# Patient Record
Sex: Female | Born: 1990 | Race: White | Hispanic: No | Marital: Single | Smoking: Never smoker
Health system: Southern US, Community
[De-identification: ages and names within clinical notes are randomized; demographics above are authoritative.]

## PROBLEM LIST (undated history)

## (undated) DIAGNOSIS — N83209 Unspecified ovarian cyst, unspecified side: Secondary | ICD-10-CM

## (undated) DIAGNOSIS — F32A Depression, unspecified: Secondary | ICD-10-CM

## (undated) DIAGNOSIS — F329 Major depressive disorder, single episode, unspecified: Secondary | ICD-10-CM

## (undated) DIAGNOSIS — K529 Noninfective gastroenteritis and colitis, unspecified: Secondary | ICD-10-CM

## (undated) DIAGNOSIS — E079 Disorder of thyroid, unspecified: Secondary | ICD-10-CM

## (undated) HISTORY — DX: Unspecified ovarian cyst, unspecified side: N83.209

## (undated) HISTORY — DX: Major depressive disorder, single episode, unspecified: F32.9

## (undated) HISTORY — DX: Depression, unspecified: F32.A

## (undated) HISTORY — DX: Disorder of thyroid, unspecified: E07.9

## (undated) HISTORY — DX: Noninfective gastroenteritis and colitis, unspecified: K52.9

---
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
## 2016-06-22 ENCOUNTER — Encounter: Payer: Self-pay | Admitting: *Deleted

## 2016-06-22 ENCOUNTER — Ambulatory Visit (INDEPENDENT_AMBULATORY_CARE_PROVIDER_SITE_OTHER): Payer: BLUE CROSS/BLUE SHIELD | Admitting: Primary Care

## 2016-06-22 ENCOUNTER — Ambulatory Visit
Admission: RE | Admit: 2016-06-22 | Discharge: 2016-06-22 | Disposition: A | Discharge status: Home / Self Care | Payer: BLUE CROSS/BLUE SHIELD | Source: Ambulatory Visit | Attending: Primary Care | Admitting: Primary Care

## 2016-06-22 ENCOUNTER — Encounter: Payer: Self-pay | Admitting: Primary Care

## 2016-06-22 VITALS — BP 112/64 | HR 82 | Temp 98.2°F | Ht 67.5 in | Wt 202.8 lb

## 2016-06-22 DIAGNOSIS — F418 Other specified anxiety disorders: Secondary | ICD-10-CM

## 2016-06-22 DIAGNOSIS — N83202 Unspecified ovarian cyst, left side: Secondary | ICD-10-CM | POA: Diagnosis not present

## 2016-06-22 DIAGNOSIS — N83209 Unspecified ovarian cyst, unspecified side: Secondary | ICD-10-CM | POA: Insufficient documentation

## 2016-06-22 DIAGNOSIS — F419 Anxiety disorder, unspecified: Secondary | ICD-10-CM

## 2016-06-22 DIAGNOSIS — F329 Major depressive disorder, single episode, unspecified: Secondary | ICD-10-CM | POA: Insufficient documentation

## 2016-06-22 DIAGNOSIS — R197 Diarrhea, unspecified: Secondary | ICD-10-CM | POA: Diagnosis not present

## 2016-06-22 DIAGNOSIS — E039 Hypothyroidism, unspecified: Secondary | ICD-10-CM

## 2016-06-22 DIAGNOSIS — R1032 Left lower quadrant pain: Secondary | ICD-10-CM | POA: Insufficient documentation

## 2016-06-22 DIAGNOSIS — R933 Abnormal findings on diagnostic imaging of other parts of digestive tract: Secondary | ICD-10-CM | POA: Insufficient documentation

## 2016-06-22 DIAGNOSIS — N946 Dysmenorrhea, unspecified: Secondary | ICD-10-CM

## 2016-06-22 DIAGNOSIS — F32A Depression, unspecified: Secondary | ICD-10-CM | POA: Insufficient documentation

## 2016-06-22 LAB — POC URINALSYSI DIPSTICK (AUTOMATED)
Bilirubin, UA: NEGATIVE
Blood, UA: NEGATIVE
Glucose, UA: NEGATIVE
Ketones, UA: NEGATIVE
LEUKOCYTES UA: NEGATIVE
Nitrite, UA: NEGATIVE
PROTEIN UA: NEGATIVE
UROBILINOGEN UA: NEGATIVE
pH, UA: 6

## 2016-06-22 LAB — COMPREHENSIVE METABOLIC PANEL
ALT: 12 U/L (ref 0–35)
AST: 13 U/L (ref 0–37)
Albumin: 4.1 g/dL (ref 3.5–5.2)
Alkaline Phosphatase: 80 U/L (ref 39–117)
BUN: 12 mg/dL (ref 6–23)
CHLORIDE: 107 meq/L (ref 96–112)
CO2: 24 mEq/L (ref 19–32)
CREATININE: 0.82 mg/dL (ref 0.40–1.20)
Calcium: 9.7 mg/dL (ref 8.4–10.5)
GFR: 89.9 mL/min (ref >60.00)
Glucose, Bld: 97 mg/dL (ref 70–99)
Potassium: 4.1 mEq/L (ref 3.5–5.1)
SODIUM: 139 meq/L (ref 135–145)
Total Bilirubin: 0.3 mg/dL (ref 0.2–1.2)
Total Protein: 7 g/dL (ref 6.0–8.3)

## 2016-06-22 LAB — CBC WITH DIFFERENTIAL/PLATELET
BASOS ABS: 0 10*3/uL (ref 0.0–0.1)
BASOS PCT: 0.4 % (ref 0.0–3.0)
Eosinophils Absolute: 0.1 10*3/uL (ref 0.0–0.7)
Eosinophils Relative: 1.7 % (ref 0.0–5.0)
HCT: 37 % (ref 36.0–46.0)
Hemoglobin: 12.6 g/dL (ref 12.0–15.0)
LYMPHS PCT: 30.5 % (ref 12.0–46.0)
Lymphs Abs: 2 10*3/uL (ref 0.7–4.0)
MCHC: 34 g/dL (ref 30.0–36.0)
MCV: 86.4 fl (ref 78.0–100.0)
MONO ABS: 0.6 10*3/uL (ref 0.1–1.0)
Monocytes Relative: 8.6 % (ref 3.0–12.0)
NEUTROS ABS: 3.8 10*3/uL (ref 1.4–7.7)
NEUTROS PCT: 58.8 % (ref 43.0–77.0)
PLATELETS: 353 10*3/uL (ref 150.0–400.0)
RBC: 4.29 Mil/uL (ref 3.87–5.11)
RDW: 13.2 % (ref 11.5–15.5)
WBC: 6.4 10*3/uL (ref 4.0–10.5)

## 2016-06-22 LAB — POCT URINE PREGNANCY: PREG TEST UR: NEGATIVE

## 2016-06-22 MED ORDER — HYDROXYZINE HCL 25 MG PO TABS: 25.0000 mg | ORAL_TABLET | Freq: Two times a day (BID) | ORAL | 0 refills | Status: AC | PRN

## 2016-06-22 MED ORDER — HYDROXYZINE HCL 25 MG PO TABS: 25.0000 mg | ORAL_TABLET | Freq: Two times a day (BID) | ORAL | 0 refills | Status: DC | PRN

## 2016-06-22 MED ORDER — IOPAMIDOL (ISOVUE-300) INJECTION 61%
100.0000 mL | Freq: Once | INTRAVENOUS | Status: AC | PRN
  Administered 2016-06-22: 100 mL via INTRAVENOUS

## 2016-06-22 NOTE — Progress Notes (Signed)
Subjective:    Patient ID: Bonnie HammansRuth Horne, female    DOB: 06-Sep-1991, 25 y.o.   MRN: [id number]  HPI  Bonnie Horne is a 25 year old female who presents today to establish care and discuss the problems mentioned below. Will obtain old records.  1) Abdominal Pain: Located to left groin and left lower quadrant since 06/01/16 which also happened to be the first day of her LMP. That day she experienced painful, heavy bleeding, diarrhea, and several episodes of vomiting. Her symptoms lasted three days and then improved. Since then she's continued to experience intermittent diarrhea, nausea, and left groin and lower quadrant pain. She's taken Naproxen and Tums for her symptoms with some improvement. Over the past week her pain has been more constant and is increasing. Overall the pain is worse. Denies fevers, weakness, fatigue, mucous or blood in the stools.  2) Depression/PTSD, Borderline Personality Disorder: She is following with a therapist and see's her weekly. Currently managed on Zoloft 100 mg, Trazodone PRN which is causing increased irritability, and dissolvable Clonazepam 0.5 mg that she takes infrequently. Her last dose of Trazone was this week, and does not take often given the side effects of increased anxiety. She's not tried anything OTC for difficulty sleeping.   3) Hypothyroidism: Originally diagnosed with hyperthyroidism with goiter and underwent radiation treatment in 2011. Currently managed on levothyroxine 125 mcg. She has been on this dose for 3 years. Her last TSH was normal in February, but she doesn't believe she was taking her medication correctly. She's been taking her medication with food and also with her other medications. She recently learned 2 weeks ago that this was incorrect and has been taking it correctly.   4) Menorrhagia/Dysmenorrhea: Occur monthly on a regular cycle. Will sometimes prevent her from working. LMP 06/01/16. She was previously managed on birth control for  reasons unrelated to current symptoms, but has not taken in years.  Review of Systems  Constitutional: Positive for fatigue. Negative for chills and fever.  HENT: Negative for rhinorrhea.   Respiratory: Negative for shortness of breath.   Cardiovascular: Negative for chest pain.  Gastrointestinal: Positive for abdominal pain, diarrhea and nausea. Negative for blood in stool and vomiting.  Endocrine: Negative for cold intolerance.  Genitourinary: Positive for menstrual problem and pelvic pain. Negative for dysuria, flank pain, hematuria and vaginal discharge.  Neurological: Negative for dizziness and weakness.  Hematological: Negative for adenopathy.  Psychiatric/Behavioral: Positive for sleep disturbance. Negative for suicidal ideas. The patient is not nervous/anxious.        Past Medical History:  Diagnosis Date  . Depression   . Thyroid disease      Social History   Social History  . Marital status: Single    Spouse name: N/A  . Number of children: N/A  . Years of education: N/A   Occupational History  . Not on file.   Social History Main Topics  . Smoking status: Never Smoker  . Smokeless tobacco: Never Used  . Alcohol use No  . Drug use: Unknown  . Sexual activity: Not on file   Other Topics Concern  . Not on file   Social History Narrative  . No narrative on file    Past Surgical History:  Procedure Laterality Date  . TONSILLECTOMY AND ADENOIDECTOMY  1998    Family History  Problem Relation Age of Onset  . Mental illness Mother   . Mental illness Father   . Diabetes Father   . Hypertension Father   .  Arthritis Maternal Grandfather   . Diabetes Paternal Grandmother   . Hypertension Paternal Grandmother   . Diabetes Paternal Grandfather   . Stroke Paternal Grandfather   . Hypertension Paternal Grandfather     No Known Allergies  No current outpatient prescriptions on file prior to visit.   No current facility-administered medications on file  prior to visit.     BP 112/64   Pulse 82   Temp 98.2 F (36.8 C) (Oral)   Ht 5' 7.5" (1.715 m)   Wt 202 lb 12.8 oz (92 kg)   LMP 06/01/2016   SpO2 98%   BMI 31.29 kg/m    Objective:   Physical Exam  Constitutional: She is oriented to person, place, and time. She appears well-nourished.  Neck: Neck supple.  Cardiovascular: Normal rate and regular rhythm.   Pulmonary/Chest: Effort normal and breath sounds normal.  Abdominal: Soft. Normal appearance and bowel sounds are normal. There is tenderness in the suprapubic area and left lower quadrant. There is no CVA tenderness, no tenderness at McBurney's point and negative Murphy's sign.  Neurological: She is alert and oriented to person, place, and time.  Skin: Skin is warm and dry.  Psychiatric: She has a normal mood and affect.          Assessment & Plan:  Abdominal Pain:  Located to LLQ and left groin x 3 weeks. Pain now worse, also with diarrhea. No alarm signs, recent antibiotic use. Exam with moderate tenderness to LLQ and left groin. CT abdomen/pelvis obtained with evidence of left lower colitis and left ovarian cyst. CBC and CMP unremarkable. Given duration of symptoms without resolve, will treat with oral antibiotics. Rx for Augmentin sent to pharmacy. Will likely need birth control for ovarian cyst and menstrual complaints. Will wait until completion of antibiotics. Discussed bland diet. Return precautions provided.  Morrie Sheldon, NP
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
## 2016-06-22 NOTE — Assessment & Plan Note (Signed)
Diagnosed in 2011 originally with hyperthyroidism with goiter. Stable on levothyroxine 125 mcg. Will recheck TSH in 1 month after she's taken her medication correctly.

## 2016-06-22 NOTE — Progress Notes (Signed)
Pre visit review using our clinic review tool, if applicable. No additional management support is needed unless otherwise documented below in the visit note. 

## 2016-06-22 NOTE — Patient Instructions (Signed)
Complete lab work prior to leaving today.   Stop by the front desk and speak with either Shirlee LimerickMarion or Revonda StandardAllison regarding your CT scan.  Only consume a clear liquid diet until you hear from me.  I will be in touch later today.  Start Hydroxyzine 25 mg tablets. Take 1 tablet by mouth at bedtime for sleep/anxiety. May take up to two times daily as needed.  Refrain from using the Clonazepam.  It was a pleasure to meet you today! Please don't hesitate to call me with any questions. Welcome to [facility]!

## 2016-06-22 NOTE — Assessment & Plan Note (Signed)
As evidenced by CT scan. Will likely initiate birth control, especially given other menstrual complaints.  Will await resolve of colitis.

## 2016-06-22 NOTE — Assessment & Plan Note (Signed)
Occurs monthly with menorrhagia. Plan is to initiate birth control after completion of antibiotics.

## 2016-06-25 ENCOUNTER — Other Ambulatory Visit: Payer: Self-pay | Admitting: Primary Care

## 2016-06-25 ENCOUNTER — Telehealth: Payer: Self-pay | Admitting: Primary Care

## 2016-06-25 DIAGNOSIS — K529 Noninfective gastroenteritis and colitis, unspecified: Secondary | ICD-10-CM

## 2016-06-25 MED ORDER — AMOXICILLIN-POT CLAVULANATE 875-125 MG PO TABS: 1.0000 | ORAL_TABLET | Freq: Two times a day (BID) | ORAL | 0 refills | Status: AC

## 2016-06-25 NOTE — Telephone Encounter (Signed)
In the result note of the CT scan, patient is suppose to be taking Augmentin antibiotics. Please advise.

## 2016-06-25 NOTE — Telephone Encounter (Signed)
Per DPR, left detail message of Kate's comments. 

## 2016-06-25 NOTE — Telephone Encounter (Signed)
Pt called because she was in last week and was prescribed an antibiotic but [facility] said they never received the prescription.  Can you please call her medication into [facility].  Thanks!

## 2016-06-25 NOTE — Telephone Encounter (Signed)
Not sure what happened, Rx resent to [facility]. Please notify patient.

## 2016-06-26 ENCOUNTER — Encounter: Payer: Self-pay | Admitting: Primary Care

## 2016-06-26 ENCOUNTER — Encounter: Payer: Self-pay | Admitting: Emergency Medicine

## 2016-06-26 ENCOUNTER — Other Ambulatory Visit: Payer: Self-pay | Admitting: Primary Care

## 2016-06-26 DIAGNOSIS — N83202 Unspecified ovarian cyst, left side: Secondary | ICD-10-CM | POA: Diagnosis not present

## 2016-06-26 DIAGNOSIS — K529 Noninfective gastroenteritis and colitis, unspecified: Secondary | ICD-10-CM | POA: Diagnosis not present

## 2016-06-26 DIAGNOSIS — E039 Hypothyroidism, unspecified: Secondary | ICD-10-CM | POA: Insufficient documentation

## 2016-06-26 DIAGNOSIS — R1012 Left upper quadrant pain: Secondary | ICD-10-CM | POA: Diagnosis present

## 2016-06-26 DIAGNOSIS — R112 Nausea with vomiting, unspecified: Secondary | ICD-10-CM

## 2016-06-26 LAB — COMPREHENSIVE METABOLIC PANEL
ALK PHOS: 76 U/L (ref 38–126)
ALT: 12 U/L — ABNORMAL LOW (ref 14–54)
AST: 22 U/L (ref 15–41)
Albumin: 4.4 g/dL (ref 3.5–5.0)
Anion gap: 8 (ref 5–15)
BILIRUBIN TOTAL: 0.4 mg/dL (ref 0.3–1.2)
BUN: 9 mg/dL (ref 6–20)
CALCIUM: 9.1 mg/dL (ref 8.9–10.3)
CO2: 25 mmol/L (ref 22–32)
Chloride: 106 mmol/L (ref 101–111)
Creatinine, Ser: 0.94 mg/dL (ref 0.44–1.00)
GFR calc non Af Amer: >60 mL/min (ref >60)
Glucose, Bld: 91 mg/dL (ref 65–99)
Potassium: 3.9 mmol/L (ref 3.5–5.1)
Sodium: 139 mmol/L (ref 135–145)
TOTAL PROTEIN: 7.4 g/dL (ref 6.5–8.1)

## 2016-06-26 LAB — URINALYSIS COMPLETE WITH MICROSCOPIC (ARMC ONLY)
Bilirubin Urine: NEGATIVE
GLUCOSE, UA: NEGATIVE mg/dL
Hgb urine dipstick: NEGATIVE
KETONES UR: NEGATIVE mg/dL
NITRITE: NEGATIVE
Protein, ur: NEGATIVE mg/dL
SPECIFIC GRAVITY, URINE: 1.006 (ref 1.005–1.030)
pH: 6 (ref 5.0–8.0)

## 2016-06-26 LAB — CBC
HCT: 36.9 % (ref 35.0–47.0)
HEMOGLOBIN: 12.8 g/dL (ref 12.0–16.0)
MCH: 29.6 pg (ref 26.0–34.0)
MCHC: 34.6 g/dL (ref 32.0–36.0)
MCV: 85.8 fL (ref 80.0–100.0)
Platelets: 317 10*3/uL (ref 150–440)
RBC: 4.3 MIL/uL (ref 3.80–5.20)
RDW: 13 % (ref 11.5–14.5)
WBC: 9.6 10*3/uL (ref 3.6–11.0)

## 2016-06-26 LAB — POCT PREGNANCY, URINE: Preg Test, Ur: NEGATIVE

## 2016-06-26 LAB — LIPASE, BLOOD: Lipase: 20 U/L (ref 11–51)

## 2016-06-26 LAB — TROPONIN I: Troponin I: <0.03 ng/mL (ref <0.03)

## 2016-06-26 MED ORDER — ONDANSETRON 4 MG PO TBDP: 4.0000 mg | ORAL_TABLET | Freq: Three times a day (TID) | ORAL | 0 refills | Status: AC | PRN

## 2016-06-26 NOTE — ED Triage Notes (Signed)
Pt ambulatory to triage with steady gait, no distress noted. Pt sent by [facility] for further evaluation of colitis and ovarian cyst that pt was diagnosed with on Friday 06/22/16. Pt c/o LUQ abdominal pain. Reports N/V/D.

## 2016-06-26 NOTE — ED Notes (Signed)
Pt to triage desk in reports her LUQ abdominal pain has increased. Charge nurse notified.

## 2016-06-27 ENCOUNTER — Emergency Department
Admission: EM | Admit: 2016-06-27 | Discharge: 2016-06-27 | Disposition: A | Discharge status: Home / Self Care | Payer: BLUE CROSS/BLUE SHIELD | Attending: Emergency Medicine | Admitting: Emergency Medicine

## 2016-06-27 DIAGNOSIS — K529 Noninfective gastroenteritis and colitis, unspecified: Secondary | ICD-10-CM

## 2016-06-27 DIAGNOSIS — N83202 Unspecified ovarian cyst, left side: Secondary | ICD-10-CM

## 2016-06-27 DIAGNOSIS — R1012 Left upper quadrant pain: Secondary | ICD-10-CM

## 2016-06-27 MED ORDER — ONDANSETRON HCL 4 MG/2ML IJ SOLN
4.0000 mg | Freq: Once | INTRAMUSCULAR | Status: AC
  Administered 2016-06-27: 4 mg via INTRAVENOUS
  Filled 2016-06-27: qty 2

## 2016-06-27 MED ORDER — MORPHINE SULFATE (PF) 4 MG/ML IV SOLN
INTRAVENOUS | Status: AC
  Filled 2016-06-27: qty 1

## 2016-06-27 MED ORDER — SODIUM CHLORIDE 0.9 % IV BOLUS (SEPSIS)
1000.0000 mL | Freq: Once | INTRAVENOUS | Status: AC
  Administered 2016-06-27: 1000 mL via INTRAVENOUS

## 2016-06-27 MED ORDER — OXYCODONE-ACETAMINOPHEN 5-325 MG PO TABS: 1.0000 | ORAL_TABLET | ORAL | 0 refills | Status: AC | PRN

## 2016-06-27 MED ORDER — MORPHINE SULFATE (PF) 4 MG/ML IV SOLN
4.0000 mg | Freq: Once | INTRAVENOUS | Status: AC
  Administered 2016-06-27: 4 mg via INTRAVENOUS
  Filled 2016-06-27: qty 1

## 2016-06-27 NOTE — ED Provider Notes (Signed)
[facility] Emergency Department Provider Note   ____________________________________________   First MD Initiated Contact with Patient 06/27/16 0106     (approximate)  I have reviewed the triage vital signs and the nursing notes.   HISTORY  Chief Complaint Abdominal Pain    HPI Bonnie HammansRuth Kemmerer is a 25 y.o. female who was instructed by her PCP to present to the emergency department for further evaluation of abdominal pain. Patient saw her PCP 4 days ago; at that time, she had been having a 3 week history of left-sided abdominal and pelvic pain with vomiting and diarrhea. She underwent lab work and CT scan which demonstrated colitis and left-sided ovarian cyst. She was placed on Augmentin only. Patient continued to have some vomiting and diarrhea over the weekend. Spoke with her PCP yesterday who called and Zofran. Then her PCP called to send patient to the ED for further evaluation of her pain. Patient denies associated fever, chills, chest pain, shortness of breath, dysuria. Zofran helped her nausea. Nothing makes her pain better or worse. Denies recent travel, camping or recent antibiotic use prior to starting on Augmentin.   Past Medical History:  Diagnosis Date  . Colitis   . Depression   . Ovarian cyst   . Thyroid disease     Patient Active Problem List   Diagnosis Date Noted  . Anxiety and depression 06/22/2016  . Hypothyroidism 06/22/2016  . Ovarian cyst 06/22/2016  . Dysmenorrhea 06/22/2016    Past Surgical History:  Procedure Laterality Date  . TONSILLECTOMY AND ADENOIDECTOMY  1998    Prior to Admission medications   Medication Sig Start Date End Date Taking? Authorizing Provider  amoxicillin-clavulanate (AUGMENTIN) 875-125 MG tablet Take 1 tablet by mouth 2 (two) times daily. 06/25/16  Yes Doreene NestKatherine K Clark, NP  hydrOXYzine (ATARAX/VISTARIL) 25 MG tablet Take 1 tablet (25 mg total) by mouth 2 (two) times daily as needed for anxiety.  06/22/16  Yes Doreene NestKatherine K Clark, NP  levothyroxine (SYNTHROID, LEVOTHROID) 125 MCG tablet Take 125 mcg by mouth daily before breakfast.   Yes Historical Provider, MD  ondansetron (ZOFRAN ODT) 4 MG disintegrating tablet Take 1 tablet (4 mg total) by mouth every 8 (eight) hours as needed for nausea or vomiting. 06/26/16  Yes Doreene NestKatherine K Clark, NP  sertraline (ZOLOFT) 100 MG tablet Take 100 mg by mouth daily.   Yes Historical Provider, MD  oxyCODONE-acetaminophen (ROXICET) 5-325 MG tablet Take 1 tablet by mouth every 4 (four) hours as needed for severe pain. 06/27/16   Irean HongJade J Leontine Radman, MD    Allergies Review of patient's allergies indicates no known allergies.  Family History  Problem Relation Age of Onset  . Mental illness Mother   . Mental illness Father   . Diabetes Father   . Hypertension Father   . Arthritis Maternal Grandfather   . Diabetes Paternal Grandmother   . Hypertension Paternal Grandmother   . Diabetes Paternal Grandfather   . Stroke Paternal Grandfather   . Hypertension Paternal Grandfather     Social History Social History  Substance Use Topics  . Smoking status: Never Smoker  . Smokeless tobacco: Never Used  . Alcohol use No    Review of Systems  Constitutional: No fever/chills. Eyes: No visual changes. ENT: No sore throat. Cardiovascular: Denies chest pain. Respiratory: Denies shortness of breath. Gastrointestinal: Positive for abdominal pain, nausea, vomiting and diarrhea.  No constipation. Genitourinary: Negative for dysuria. Musculoskeletal: Negative for back pain. Skin: Negative for rash. Neurological: Negative for  headaches, focal weakness or numbness.  10-point ROS otherwise negative.  ____________________________________________   PHYSICAL EXAM:  VITAL SIGNS: ED Triage Vitals  Enc Vitals Group     BP 06/26/16 2207 111/69     Pulse Rate 06/26/16 2207 91     Resp 06/26/16 2207 15     Temp 06/26/16 2207 98 F (36.7 C)     Temp Source 06/26/16  2207 Oral     SpO2 06/26/16 2207 98 %     Weight 06/26/16 2208 190 lb (86.2 kg)     Height 06/26/16 2208 5\' 7"  (1.702 m)     Head Circumference --      Peak Flow --      Pain Score --      Pain Loc --      Pain Edu? --      Excl. in GC? --     Constitutional: Alert and oriented. Well appearing and in no acute distress. Eyes: Conjunctivae are normal. PERRL. EOMI. Head: Atraumatic. Nose: No congestion/rhinnorhea. Mouth/Throat: Mucous membranes are mildly dry.  Oropharynx non-erythematous. Neck: No stridor.   Cardiovascular: Normal rate, regular rhythm. Grossly normal heart sounds.  Good peripheral circulation. Respiratory: Normal respiratory effort.  No retractions. Lungs CTAB. Gastrointestinal: Soft and mildly tender to palpation left upper, lateral and lower quadrant without rebound or guarding. No distention. No abdominal bruits. No CVA tenderness. Musculoskeletal: No lower extremity tenderness nor edema.  No joint effusions. Neurologic:  Normal speech and language. No gross focal neurologic deficits are appreciated. No gait instability. Skin:  Skin is warm, dry and intact. No rash noted. Psychiatric: Mood and affect are normal. Speech and behavior are normal.  ____________________________________________   LABS (all labs ordered are listed, but only abnormal results are displayed)  Labs Reviewed  COMPREHENSIVE METABOLIC PANEL - Abnormal; Notable for the following:       Result Value   ALT 12 (*)    All other components within normal limits  URINALYSIS COMPLETEWITH MICROSCOPIC ([facility] ONLY) - Abnormal; Notable for the following:    Color, Urine YELLOW (*)    APPearance CLEAR (*)    Leukocytes, UA TRACE (*)    Bacteria, UA RARE (*)    Squamous Epithelial / LPF 0-5 (*)    All other components within normal limits  LIPASE, BLOOD  CBC  TROPONIN I  POC URINE PREG, ED  POCT PREGNANCY, URINE   ____________________________________________  EKG  ED ECG REPORT I, Chaos Carlile  J, the attending physician, personally viewed and interpreted this ECG.   Date: 06/27/2016  EKG Time: 2221  Rate: 80  Rhythm: normal EKG, normal sinus rhythm  Axis: Normal  Intervals:none  ST&T Change: Nonspecific  ____________________________________________  RADIOLOGY  None ____________________________________________   PROCEDURES  Procedure(s) performed: None  Procedures  Critical Care performed: No  ____________________________________________   INITIAL IMPRESSION / ASSESSMENT AND PLAN / ED COURSE  Pertinent labs & imaging results that were available during my care of the patient were reviewed by me and considered in my medical decision making (see chart for details).  25 year old female with a recent diagnosis of colitis and ovarian cyst on Augmentin; nothing for pain. She is afebrile with normal white count. Reviewed results of CT scan from last week. Will infuse IV fluids, IV analgesia and reassess.  Clinical Course  Comment By Time  Pain greatly improved after IV morphine. Patient already has Augmentin and Zofran at home. Will write a limited quantity of Percocet as needed for pain. Strict return  precautions given. Patient and mother verbalize understanding and agree with plan of care. Irean Hong, MD 10/11 0320     ____________________________________________   FINAL CLINICAL IMPRESSION(S) / ED DIAGNOSES  Final diagnoses:  Left upper quadrant pain  Cyst of left ovary  Colitis      NEW MEDICATIONS STARTED DURING THIS VISIT:  New Prescriptions   OXYCODONE-ACETAMINOPHEN (ROXICET) 5-325 MG TABLET    Take 1 tablet by mouth every 4 (four) hours as needed for severe pain.     Note:  This document was prepared using Dragon voice recognition software and may include unintentional dictation errors.    Irean Hong, MD 06/27/16 [phone]

## 2016-06-27 NOTE — Discharge Instructions (Signed)
1. Continue and finish antibiotic as prescribed by your doctor. 2. You may take the Zofran you have at home as needed for nausea. 3. You may take Percocet as needed for pain. 4. Continue bland diet as instructed by your doctor. 5. Return to the ER for worsening symptoms, persistent vomiting, difficulty breathing or other concerns.

## 2016-06-27 NOTE — ED Notes (Signed)
Morphine dose was wasted as the plunger was backed out of the syringe by the pressure of the IV bag. Wasted and another dose pulled from pyxis. Administered without incident

## 2016-06-29 ENCOUNTER — Encounter: Payer: Self-pay | Admitting: Primary Care

## 2016-07-02 ENCOUNTER — Other Ambulatory Visit: Payer: Self-pay | Admitting: Primary Care

## 2016-07-02 ENCOUNTER — Encounter: Payer: Self-pay | Admitting: Primary Care

## 2016-07-02 ENCOUNTER — Telehealth: Payer: Self-pay

## 2016-07-02 DIAGNOSIS — R1032 Left lower quadrant pain: Secondary | ICD-10-CM

## 2016-07-02 NOTE — Telephone Encounter (Signed)
Spoken and notified patient of Kate's comments. Patient stated that she is having a lot more pain. So went ahead and schedule appt for 07/03/2016.

## 2016-07-02 NOTE — Telephone Encounter (Signed)
Please call patient back and notify her that I am her PCP. Please schedule her for follow up if her symptoms persist.

## 2016-07-02 NOTE — Telephone Encounter (Signed)
I spoke with pt and she said she does not have PCP and plans to make appt with physician at [facility]. Advised pt she needs to call and schedule appt. Pt voiced understanding.

## 2016-07-02 NOTE — Telephone Encounter (Signed)
PLEASE NOTE: All timestamps contained within this report are represented as [place] Standard Time. CONFIDENTIALTY NOTICE: This fax transmission is intended only for the addressee. It contains information that is legally privileged, confidential or otherwise protected from use or disclosure. If you are not the intended recipient, you are strictly prohibited from reviewing, disclosing, copying using or disseminating any of this information or taking any action in reliance on or regarding this information. If you have received this fax in error, please notify us immediately by telephone so that we can arrange for its return to [place]. Phone: [phone], Toll-Free: [phone], Fax: [phone] Page: 1 of 2 Call Id: [id number] [facility] Night - Client TELEPHONE ADVICE RECORD [facility] Patient Name: Bonnie Horne Gender: Female DOB: [birth year] Age: 25 Y 6 M 15 D Return Phone Number: [phone] (Primary) Address: City/State/Zip: [place] [address] Client [facility] Night - Client Client [facility] - Night Physician Vernona Rieger - NP Contact Type Call Who Is Calling Patient / Member / Family / Caregiver Call Type Triage / Clinical Relationship To Patient Self Return Phone Number [phone] (Primary) Chief Complaint ABDOMINAL PAIN - Severe and only in abdomen Reason for Call Symptomatic / Request for Health Information Initial Comment Caller states she is in severe pain in Abdominal Region and needs Antibiotic, she has Colitis. PreDisposition Call Doctor Translation No Nurse Assessment Nurse: Sabino Gasser, RN, Melissa Date/Time Lamount Cohen Time): 06/30/2016 4:10:13 PM Confirm and document reason for call. If symptomatic, describe symptoms. You must click the next button to save text entered. ---Caller states she is in severe pain in Abdominal Region and needs Antibiotic, she has Colitis. Has the  patient traveled out of the country within the last 30 days? ---Not Applicable Does the patient have any new or worsening symptoms? ---Yes Will a triage be completed? ---Yes Related visit to physician within the last 2 weeks? ---Yes Does the PT have any chronic conditions? (i.e. diabetes, asthma, etc.) ---Yes List chronic conditions. ---anxiety/depression, hypothyroidism, dx with Colitis on Monday. Is the patient pregnant or possibly pregnant? (Ask all females between the ages of 37-55) ---No Is this a behavioral health or substance abuse call? ---No Guidelines Guideline Title Affirmed Question Affirmed Notes Nurse Date/Time Lamount Cohen Time) Abdominal Pain - Female [1] SEVERE pain (e.g., excruciating) AND [2] present > 1 hour [place], RN, Melissa 06/30/2016 4:14:21 PM Disp. Time Lamount Cohen Time) Disposition Final User 06/30/2016 4:08:42 PM Send to Urgent Queue [facility] PLEASE NOTE: All timestamps contained within this report are represented as [place] Standard Time. CONFIDENTIALTY NOTICE: This fax transmission is intended only for the addressee. It contains information that is legally privileged, confidential or otherwise protected from use or disclosure. If you are not the intended recipient, you are strictly prohibited from reviewing, disclosing, copying using or disseminating any of this information or taking any action in reliance on or regarding this information. If you have received this fax in error, please notify us immediately by telephone so that we can arrange for its return to [place]. Phone: [phone], Toll-Free: [phone], Fax: [phone] Page: 2 of 2 Call Id: [id number] 06/30/2016 4:16:33 PM Go to ED Now Yes Sabino Gasser, RN, Efraim Kaufmann Caller Understands: Yes Disagree/Comply: Comply Care Advice Given Per Guideline GO TO ED NOW: You need to be seen in the Emergency Department. Go to the ER at ___________ Hospital. Leave now. Drive carefully. * Please bring a list of your  current medicines when you go to the Emergency  Department (ER). * It is also a good idea to bring the pill bottles too. This will help the doctor to make certain you are taking the right medicines and the right dose. NOTHING BY MOUTH: Do not eat or drink anything for now. (Reason: condition may need surgery and general anesthesia.) Comments User: Oralia ManisMelissa, [place], RN Date/Time (Eastern Time): 06/30/2016 4:19:00 PM caller reports she is having 10/10 pain in the upper abd. all day today, "collapsed at work" today. Caller reports has been on Augmentin since Monday but has not helped , dx with Colitis. Referrals [facility] - ED

## 2016-07-02 NOTE — Telephone Encounter (Signed)
PLEASE NOTE: All timestamps contained within this report are represented as [place] Standard Time. CONFIDENTIALTY NOTICE: This fax transmission is intended only for the addressee. It contains information that is legally privileged, confidential or otherwise protected from use or disclosure. If you are not the intended recipient, you are strictly prohibited from reviewing, disclosing, copying using or disseminating any of this information or taking any action in reliance on or regarding this information. If you have received this fax in error, please notify us immediately by telephone so that we can arrange for its return to [place]. Phone: [phone], Toll-Free: [phone], Fax: [phone] Page: 1 of 2 Call Id: [id number] [facility] Night - Client TELEPHONE ADVICE RECORD [facility] Patient Name: Bonnie Horne Gender: Female DOB: November 12, 1990 Age: 25 Y 6 M 15 D Return Phone Number: [phone] (Primary), [phone] (Secondary) Address: [place] [address] Client [facility] Night - Client Client Site [facility] [place] - Night Physician Vernona Rieger - NP Contact Type Call Who Is Calling Patient / Member / Family / Caregiver Call Type Triage / Clinical Caller Name [place] Winslow Relationship To Patient Mother Return Phone Number [phone] (Primary) Chief Complaint Medication reaction Reason for Call Medication Question Initial Comment Caller states her dtr was recently given abx for colitis and ovarian cysts. However, the abx causes stomach upset adn requests a different abx. Caller would like afterhours to page physician, they refuse to return to the ER. Pharmacy is [facility] on [address] - # [phone] PreDisposition Call Doctor Translation No Nurse Assessment Nurse: Laural Benes, RN, Dondra Spry Date/Time Lamount Cohen Time): 06/30/2016 5:51:12 PM Confirm and document reason for call. If  symptomatic, describe symptoms. You must click the next button to save text entered. ---Desarai was seen in office last week given antibiotic for colitis dx received them on Monday of this week; was seen in ED on Tuesday evening for symptoms that got worse; ED gave pain medications IV due not drinking or eating; anti nausea given also. Continues to be in a lot of pain --stomach is really upset -- augmentin 875mg  Has the patient traveled out of the country within the last 30 days? ---No Does the patient have any new or worsening symptoms? ---Yes Will a triage be completed? ---Yes Related visit to physician within the last 2 weeks? ---Yes Does the PT have any chronic conditions? (i.e. diabetes, asthma, etc.) ---Unknown Is the patient pregnant or possibly pregnant? (Ask all females between the ages of 60-55) ---No Is this a behavioral health or substance abuse call? ---No PLEASE NOTE: All timestamps contained within this report are represented as [place] Standard Time. CONFIDENTIALTY NOTICE: This fax transmission is intended only for the addressee. It contains information that is legally privileged, confidential or otherwise protected from use or disclosure. If you are not the intended recipient, you are strictly prohibited from reviewing, disclosing, copying using or disseminating any of this information or taking any action in reliance on or regarding this information. If you have received this fax in error, please notify us immediately by telephone so that we can arrange for its return to [place]. Phone: [phone], Toll-Free: [phone], Fax: [phone] Page: 2 of 2 Call Id: [id number] Guidelines Guideline Title Affirmed Question Affirmed Notes Nurse Date/Time Lamount Cohen Time) Abdominal Pain - Upper [1] SEVERE pain (e.g., excruciating) AND [2] present > 1 hour Liberty Handy 06/30/2016 5:57:31 PM Disp. Time Lamount Cohen Time) Disposition Final User 06/30/2016 6:04:49 PM Paged On Call  back to  Call Center [place], [place], Gail 06/30/2016 6:33:36 PM Call Completed Liberty HandyJohnson, RN, Gail 06/30/2016 5:59:47 PM Go to ED Now Yes Laural BenesJohnson, RN, Suzi RootsGail Caller Understands: Yes Disagree/Comply: Disagree Disagree/Comply Reason: Disagree with instructions Care Advice Given Per Guideline GO TO ED NOW: You need to be seen in the Emergency Department. Go to the ER at _________[facility] Hospital. Leave now. Drive carefully. DRIVING: Another adult should drive. Do not delay going to the Emergency Department. If immediate transportation is not available via car or taxi, then the patient should be instructed to call [id number]. * Please bring a list of your current medicines when you go to the Emergency Department (ER). * It is also a good idea to bring the pill bottles too. This will help the doctor to make certain you are taking the right medicines and the right dose. NOTHING BY MOUTH: Do not eat or drink anything for now. (Reason: condition may need surgery and general anesthesia.) CARE ADVICE given per Abdominal Pain, Upper (Adult) guideline. Referrals GO TO FACILITY UNDECIDED Paging DoctorName Phone DateTime Result/Outcome Message Type Notes Ruthe Mannanron, Talia - MD [id number] 06/30/2016 6:04:49 PM Paged On Call Back to Call Center Doctor [facility] [phone] Ruthe MannanAron, Talia - MD 06/30/2016 6:33:26 PM Spoke with On Call - Outcome Notification Message Result Dr Dayton MartesAron given information; MD Directives: will not call in new antibiotic if symptoms worsening she needs to be re evaluated not what she wants to here but not good medical practice to just change med must be seen. Windell Mouldinguth notified.

## 2016-07-03 ENCOUNTER — Ambulatory Visit: Payer: BLUE CROSS/BLUE SHIELD | Admitting: Primary Care

## 2018-06-24 IMAGING — CT CT ABD-PELV W/ CM
1 of 2 series · 15 of 32 positions shown, 19 images · IV contrast (APPLIED)
Comparison: None.

CLINICAL DATA: LEFT lower quadrant pain since mid [place]. Pain
radiates to the LEFT groin. Diarrhea.

EXAM:
CT ABDOMEN AND PELVIS WITH CONTRAST
TECHNIQUE: Multidetector CT imaging of the abdomen and pelvis was performed
using the standard protocol following bolus administration of
intravenous contrast.
CONTRAST:  100mL 2PKDIQ-GWW IOPAMIDOL (2PKDIQ-GWW) INJECTION 61%

[Series 2: axial st · axial · 0.70mm/px · z∈[-990,-574]mm · 15 of 91 slices shown, 19 images]
[im 4/91  soft-tissue]
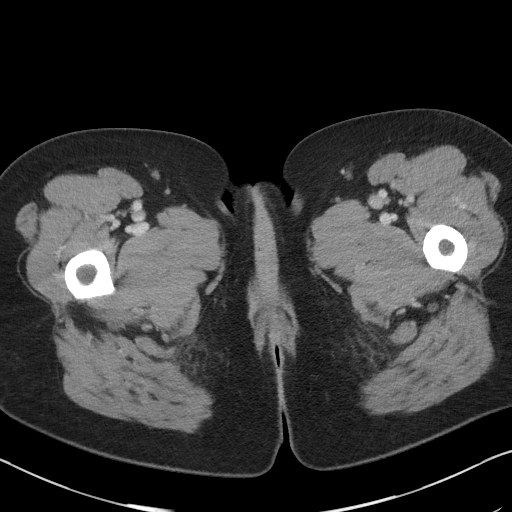
[im 4/91  bone]
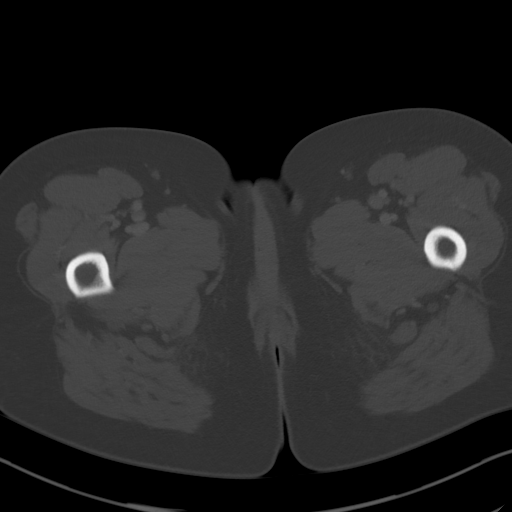
[im 12/91  soft-tissue]
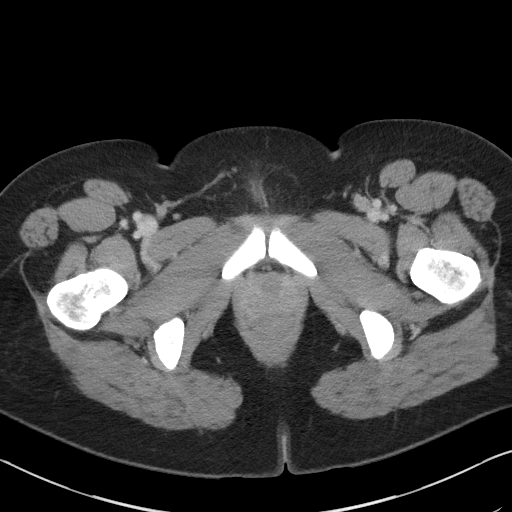
[im 19/91  soft-tissue]
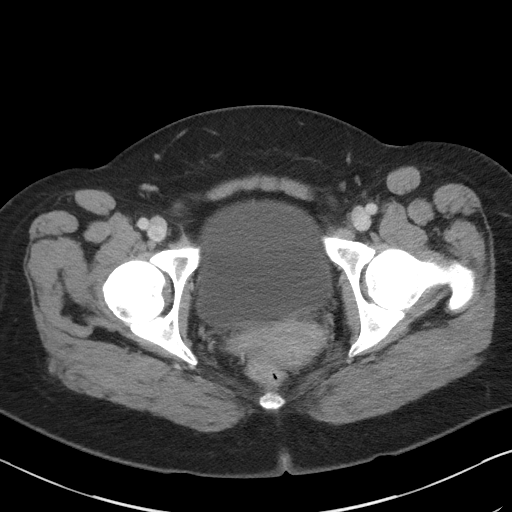
[im 27/91  soft-tissue]
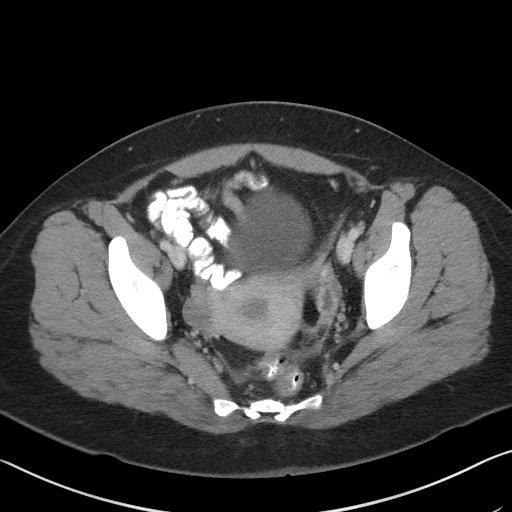
[im 31/91  soft-tissue]
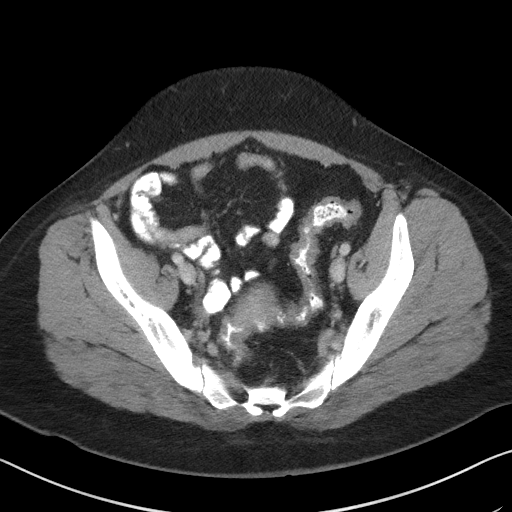
[im 38/91  soft-tissue]
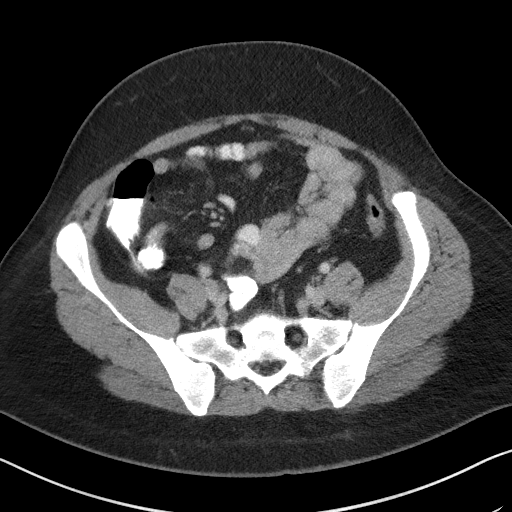
[im 46/91  soft-tissue]
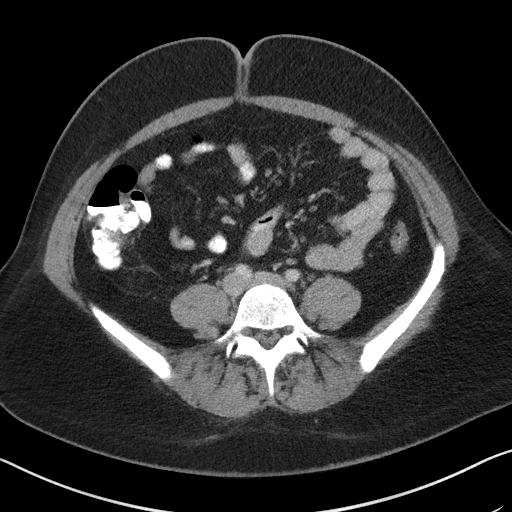
[im 53/91  soft-tissue]
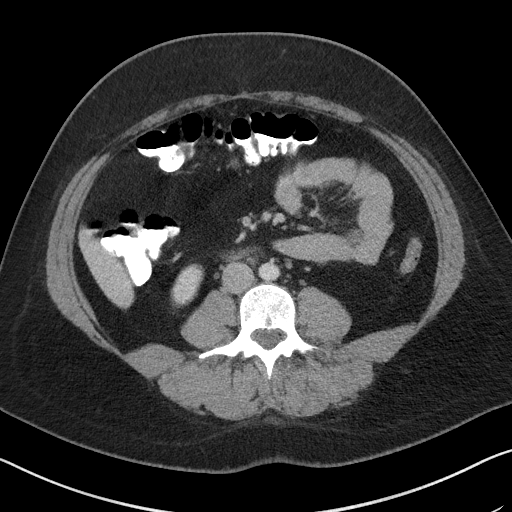
[im 61/91  soft-tissue]
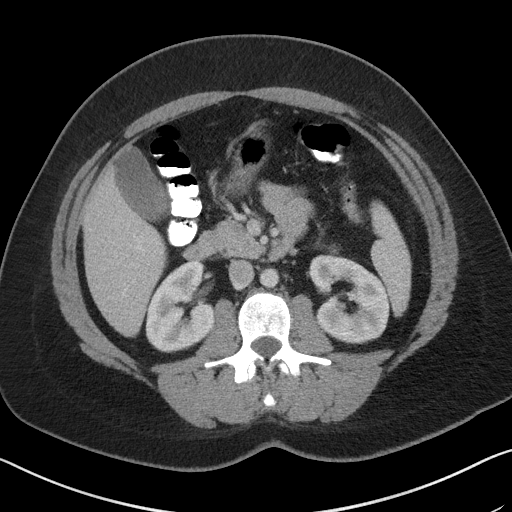
[im 61/91  bone]
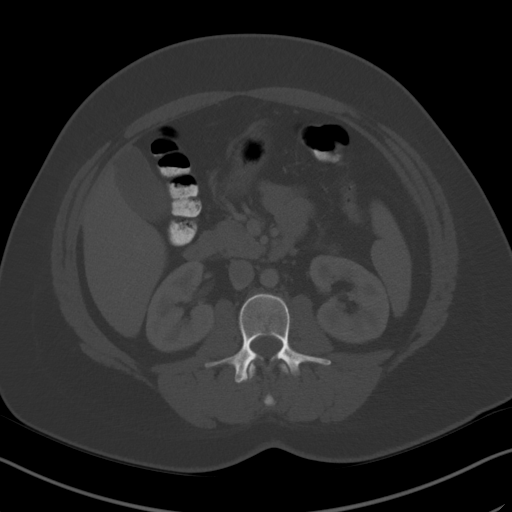
[im 64/91  soft-tissue]
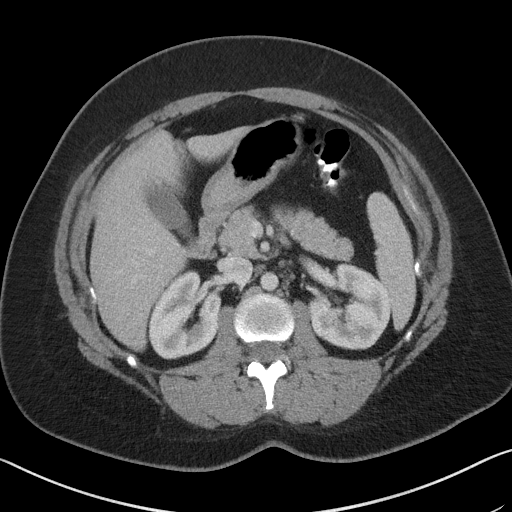
[im 72/91  soft-tissue]
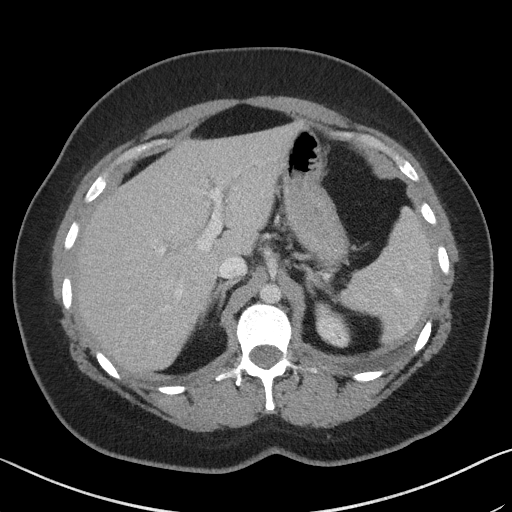
[im 76/91  lung]
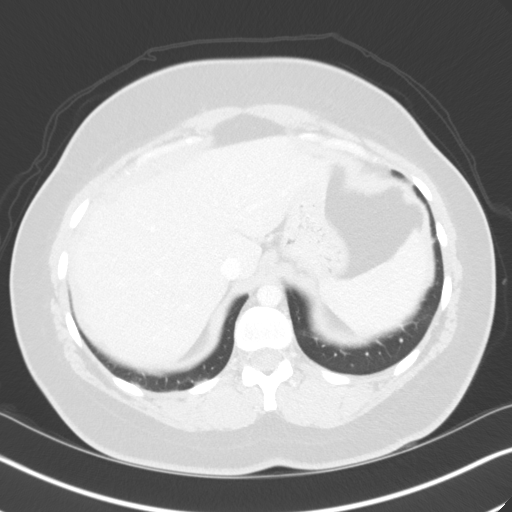
[im 79/91  soft-tissue]
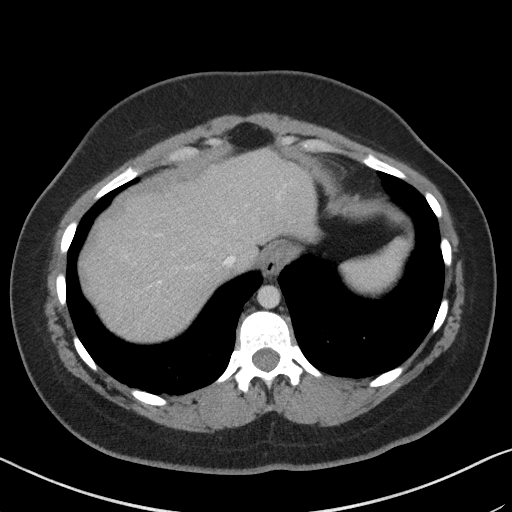
[im 79/91  lung]
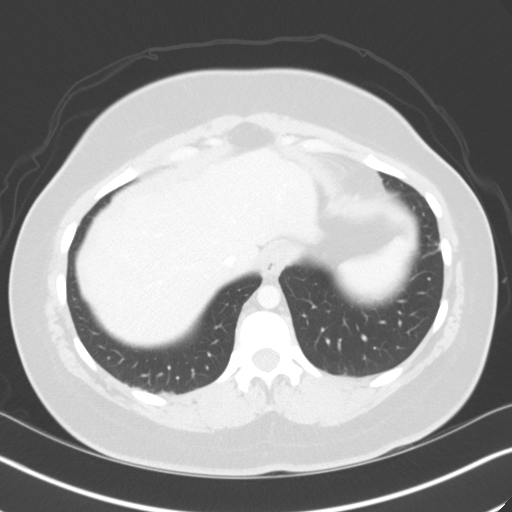
[im 83/91  lung]
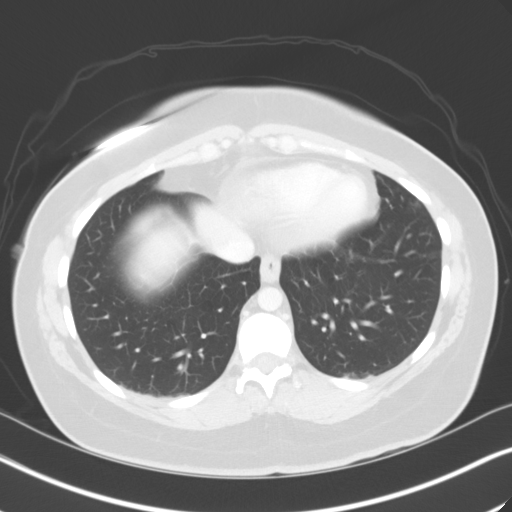
[im 87/91  soft-tissue]
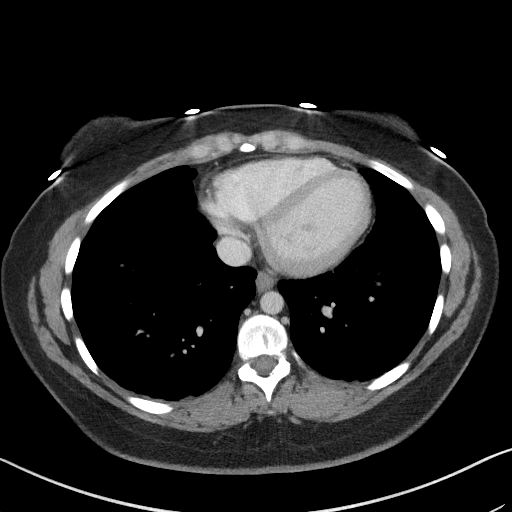
[im 87/91  lung]
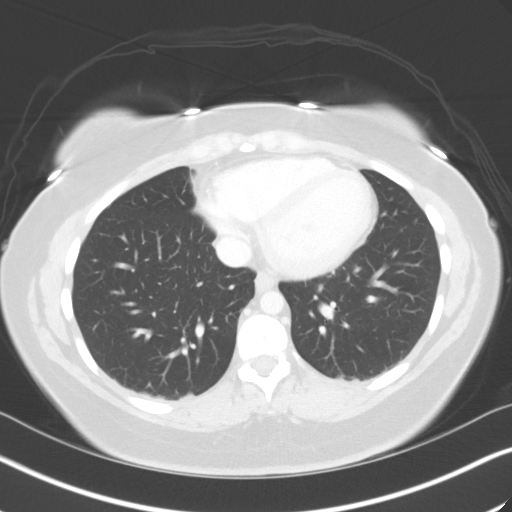

[15 of 32 positions shown; findings below may reference images not displayed]

FINDINGS: Lower chest: No acute abnormality.

Hepatobiliary: No focal liver abnormality is seen. No gallstones,
gallbladder wall thickening, or biliary dilatation.

Pancreas: Unremarkable. No pancreatic ductal dilatation or
surrounding inflammatory changes.

Spleen: Normal in size without focal abnormality.

Adrenals/Urinary Tract: Adrenal glands are unremarkable. Kidneys are
normal, without renal calculi, focal lesion, or hydronephrosis.
Bladder is unremarkable.

Stomach/Bowel: Stomach is within normal limits. Appendix appears
normal. No bowel obstruction.

In the LEFT colon, there is nonspecific wall thickening, luminal
narrowing, without significant inflammation of the surrounding fat.
The appearance is consistent with colitis, uncertain etiology.
Correlate clinically for infectious or inflammatory cause. No
visible diverticuli to suggest diverticulitis as an etiology.

Vascular/Lymphatic: No significant vascular findings are present. No
enlarged abdominal or pelvic lymph nodes.

Reproductive: Uterus and bilateral adnexa are unremarkable. There
may be a small corpus luteum cyst on the LEFT. Trace pelvic fluid.

Other: No abdominal wall hernia or abnormality. No abdominopelvic
ascites.

Musculoskeletal: No acute or significant osseous findings.
IMPRESSION: LEFT colonic nonspecific bowel wall thickening and luminal narrowing
without significant inflammation of surrounding fat. Findings
consistent with colitis of uncertain etiology. See discussion above.

Trace pelvic fluid.  Suspect small corpus luteum cyst, LEFT adnexa.
